# Patient Record
Sex: Male | Born: 1985 | Race: White | Hispanic: No | Marital: Single | Smoking: Current every day smoker
Health system: Southern US, Community
[De-identification: ages and names within clinical notes are randomized; demographics above are authoritative.]

---

## 2010-10-29 ENCOUNTER — Emergency Department (HOSPITAL_COMMUNITY)
Admission: EM | Admit: 2010-10-29 | Discharge: 2010-10-30 | Disposition: A | Discharge status: Home / Self Care | Payer: No Typology Code available for payment source | Attending: Emergency Medicine | Admitting: Emergency Medicine

## 2010-10-29 DIAGNOSIS — M542 Cervicalgia: Secondary | ICD-10-CM | POA: Insufficient documentation

## 2010-10-29 DIAGNOSIS — M25519 Pain in unspecified shoulder: Secondary | ICD-10-CM | POA: Insufficient documentation

## 2010-10-30 ENCOUNTER — Emergency Department (HOSPITAL_COMMUNITY): Payer: No Typology Code available for payment source
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
## 2017-06-10 ENCOUNTER — Emergency Department (HOSPITAL_COMMUNITY)
Admission: EM | Admit: 2017-06-10 | Discharge: 2017-06-10 | Disposition: A | Discharge status: Home / Self Care | Payer: Self-pay | Attending: Emergency Medicine | Admitting: Emergency Medicine

## 2017-06-10 ENCOUNTER — Encounter (HOSPITAL_COMMUNITY): Payer: Self-pay | Admitting: *Deleted

## 2017-06-10 DIAGNOSIS — F17200 Nicotine dependence, unspecified, uncomplicated: Secondary | ICD-10-CM | POA: Insufficient documentation

## 2017-06-10 DIAGNOSIS — K0889 Other specified disorders of teeth and supporting structures: Secondary | ICD-10-CM

## 2017-06-10 DIAGNOSIS — K047 Periapical abscess without sinus: Secondary | ICD-10-CM | POA: Insufficient documentation

## 2017-06-10 MED ORDER — CLINDAMYCIN HCL 150 MG PO CAPS
450.0000 mg | ORAL_CAPSULE | Freq: Three times a day (TID) | ORAL | 0 refills | Status: AC
Start: 2017-06-10 — End: 2017-06-20

## 2017-06-10 MED ORDER — BUPIVACAINE-EPINEPHRINE (PF) 0.5% -1:200000 IJ SOLN
1.8000 mL | Freq: Once | INTRAMUSCULAR | Status: AC
  Administered 2017-06-10: 1.8 mL
  Filled 2017-06-10: qty 1.8

## 2017-06-10 MED ORDER — CLINDAMYCIN HCL 150 MG PO CAPS
450.0000 mg | ORAL_CAPSULE | Freq: Once | ORAL | Status: DC
  Filled 2017-06-10: qty 3

## 2017-06-10 NOTE — Discharge Instructions (Addendum)
Please see the information and instructions below regarding your visit.  Your diagnoses today include:  1. Pain, dental    You have a dental infection. It is very important that you get evaluated by a dentist as soon as possible. Call tomorrow to schedule an appointment. Ibuprofen as needed for pain. Take your full course of antibiotics. Read the instructions below.  Tests performed today include: See side panel of your discharge paperwork for testing performed today. Vital signs are listed at the bottom of these instructions.   Medications prescribed:    Take any prescribed medications only as prescribed, and any over the counter medications only as directed on the packaging.  1. You are prescribed Clindamycin, an antibiotic. Please take all of your antibiotics until finished.   You may develop abdominal discomfort or nausea from the antibiotic. If this occurs, you may take it with food. Some patients also get diarrhea with antibiotics. You may help offset this with probiotics which you can buy or get in yogurt. Do not eat or take the probiotics until 2 hours after your antibiotic.  Some people develop allergies to antibiotics. Symptoms of antibiotic allergy can be mild and include a flat rash and itching. They can also be more serious and include:  ?Hives - Hives are raised, red patches of skin that are usually very itchy.  ?Lip or tongue swelling  ?Trouble swallowing or breathing  ?Blistering of the skin or mouth.  If you have any of these serious symptoms, please seek emergency medical care immediately.  2. You are recommended to take ibuprofen, a non-steroidal anti-inflammatory agent (NSAID) for pain. You may take 600 mg every 6 hours as needed for pain. If still requiring this medication around the clock for acute pain after 10 days, please see your primary healthcare provider.  You may combine this medication with Tylenol, 650 mg every 6 hours, so you are receiving something for  pain every 3 hours.  This is not a long-term medication unless under the care and direction of your primary provider. Taking this medication long-term and not under the supervision of a healthcare provider could increase the risk of stomach ulcers, kidney problems, and cardiovascular problems such as high blood pressure.   Home care instructions:  Please follow any educational materials contained in this packet.   Eat a soft or liquid diet and rinse your mouth out after meals with warm water. You should see a dentist or return here at once if you have increased swelling, increased pain or uncontrolled bleeding from the site of your injury.  Follow-up instructions: It is very important that you see a dentist as soon as possible. There is a list of dentists attached to this packet if you do not have care established with a dentist already. Please give a call to a dentist of your choice tomorrow.  Return instructions:  Please return to the Emergency Department if you experience worsening symptoms.  Please seek care if you note any of the following about your dental pain:  You have increased pain not controlled with medicines.  You have swelling around your tooth, in your face or neck.  You have bleeding which starts, continues, or gets worse.  You have a fever >101 If you are unable to open your mouth Please return if you have any other emergent concerns.  Additional Information:   Your vital signs today were: BP 126/67 (BP Location: Left Arm)    Pulse 68    Temp 98.3 F (36.8  C) (Oral)    Resp 18    SpO2 97%  If your blood pressure (BP) was elevated on multiple readings during this visit above 130 for the top number or above 80 for the bottom number, please have this repeated by your primary care provider within one month. --------------  Thank you for allowing us to participate in your care today.

## 2017-06-10 NOTE — ED Provider Notes (Signed)
[facility]-EMERGENCY DEPT Provider Note   CSN: [id number] Arrival date & time: 06/10/17  1016     History   Chief Complaint Chief Complaint  Patient presents with  . Dental Pain    HPI Read Drivershillip Madera is a 31 y.o. male.  HPI   Patient is a 31 year old male with no significant past medical history presenting for 3 days of tooth pain in the left most posterior molar.  Patient reports that he noted that it was exquisitely tender to chewing and subsequently has had some bleeding from the surrounding gum.  Patient has known dental disease.  Patient reports that he has follow-up with a dentist, however the dentist needs a referral from health care provider.  Patient denies any sore throat, pharyngeal swelling, difficulty breathing, difficulty swallowing.  Patient reports that the tooth is painful upon swallowing.  Patient denies any fever or chills.  Patient denies any lingual swelling or changes in the tissue of his neck.  History reviewed. No pertinent past medical history.  There are no active problems to display for this patient.   History reviewed. No pertinent surgical history.     Home Medications    Prior to Admission medications   Medication Sig Start Date End Date Taking? Authorizing Provider  clindamycin (CLEOCIN) 150 MG capsule Take 3 capsules (450 mg total) 3 (three) times daily for 10 days by mouth. 06/10/17 06/20/17  Elisha PonderMurray, Alyssa B, PA-C    Family History No family history on file.  Social History Social History   Tobacco Use  . Smoking status: Current Every Day Smoker  . Smokeless tobacco: Never Used  Substance Use Topics  . Alcohol use: No    Frequency: Never  . Drug use: No     Allergies   Penicillins   Review of Systems Review of Systems  Constitutional: Negative for chills and fever.  HENT: Positive for dental problem. Negative for facial swelling, sore throat, trouble swallowing and voice change.   Respiratory:  Negative for shortness of breath.      Physical Exam Updated Vital Signs BP 126/67 (BP Location: Left Arm)   Pulse 68   Temp 98.3 F (36.8 C) (Oral)   Resp 18   SpO2 97%   Physical Exam  Constitutional: He appears well-developed and well-nourished. No distress.  Sitting comfortably in bed.  HENT:  Head: Normocephalic and atraumatic.  Mouth/Throat:    Dental cavities and poor oral dentition noted. Pain along tooth #16 as depicted in image.  There appears to be an area of fluctuance medial to this tooth along the hard palate.  No abscess noted. Midline uvula. No trismus. OP clear and moist. No oropharyngeal erythema or edema. Neck supple with no tenderness. No facial edema.  Eyes: Conjunctivae are normal. Right eye exhibits no discharge. Left eye exhibits no discharge.  EOMs normal to gross examination.  Neck: Normal range of motion.  Cardiovascular: Normal rate and regular rhythm.  Intact, 2+ radial pulse  Pulmonary/Chest:  Normal respiratory effort. Patient converses comfortably. No audible wheeze or stridor.  Abdominal: He exhibits no distension.  Musculoskeletal: Normal range of motion.  Neurological: He is alert.  Cranial nerves intact to gross observation. Patient moves extremities without difficulty.  Skin: Skin is warm and dry. He is not diaphoretic.  Psychiatric: He has a normal mood and affect. His behavior is normal. Judgment and thought content normal.  Nursing note and vitals reviewed.    ED Treatments / Results  Labs (all labs ordered  are listed, but only abnormal results are displayed) Labs Reviewed - No data to display  EKG  EKG Interpretation None       Radiology No results found.  Procedures .Marland Kitchen.Incision and Drainage Date/Time: 06/10/2017 1:05 PM Performed by: Elisha PonderMurray, Alyssa B, PA-C Authorized by: Elisha PonderMurray, Alyssa B, PA-C   Consent:    Consent obtained:  Verbal   Consent given by:  Patient   Risks discussed:  Bleeding, pain and  infection Location:    Type:  Abscess   Location:  Mouth   Mouth location:  Alveolar process Anesthesia (see MAR for exact dosages):    Anesthesia method:  Local infiltration and topical application   Local anesthetic:  Bupivacaine 0.5% WITH epi Procedure type:    Complexity:  Simple Procedure details:    Incision types:  Stab incision   Scalpel blade:  11   Wound management:  Probed and deloculated   Drainage:  Bloody and serosanguinous   Drainage amount:  Scant   Wound treatment:  Wound left open Post-procedure details:    Patient tolerance of procedure:  Tolerated well, no immediate complications Comments:     Suction post-abscess drainage.   (including critical care time)  Medications Ordered in ED Medications  bupivacaine-epinephrine (MARCAINE W/ EPI) 0.5% -1:200000 injection 1.8 mL (not administered)  clindamycin (CLEOCIN) capsule 450 mg (not administered)     Initial Impression / Assessment and Plan / ED Course  I have reviewed the triage vital signs and the nursing notes.  Pertinent labs & imaging results that were available during my care of the patient were reviewed by me and considered in my medical decision making (see chart for details).      Final Clinical Impressions(s) / ED Diagnoses   Final diagnoses:  Pain, dental   Read Drivershillip Speedy is a 31 y.o. male who presents to ED for dental pain. Abscess present requiring immediate incision and drainage. I & D performed with serosanguinous drainage and scant purulence. Patient is afebrile, non toxic appearing, and swallowing secretions well. Exam not concerning for Ludwig's angina or pharyngeal abscess. Will treat with Clindamycin. I provided dental resource guide and stressed the importance of dental follow up for ultimate management of dental pain. Patient voices understanding and is agreeable to plan.   ED Discharge Orders        Ordered    clindamycin (CLEOCIN) 150 MG capsule  3 times daily     06/10/17 1258        Delia ChimesMurray, Alyssa B, PA-C 06/10/17 1307    Nira Connardama, Pedro Eduardo, MD 06/15/17 1736

## 2017-06-10 NOTE — ED Notes (Signed)
Cleocin d/c due to patient leaving and unable to wait for initial dose here.

## 2017-06-10 NOTE — ED Triage Notes (Signed)
Pt complains of left upper dental pain x 3 days.
